# Patient Record
Sex: Male | Born: 1995 | Race: White | Hispanic: No | Marital: Single | Smoking: Never smoker
Health system: Southern US, Community
[De-identification: ages and names within clinical notes are randomized; demographics above are authoritative.]

## PROBLEM LIST (undated history)

## (undated) DIAGNOSIS — K219 Gastro-esophageal reflux disease without esophagitis: Secondary | ICD-10-CM

## (undated) DIAGNOSIS — F32A Depression, unspecified: Secondary | ICD-10-CM

## (undated) DIAGNOSIS — F329 Major depressive disorder, single episode, unspecified: Secondary | ICD-10-CM

## (undated) HISTORY — DX: Major depressive disorder, single episode, unspecified: F32.9

## (undated) HISTORY — DX: Depression, unspecified: F32.A

---
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
## 2020-10-20 ENCOUNTER — Encounter (HOSPITAL_COMMUNITY): Payer: Self-pay | Admitting: Emergency Medicine

## 2020-10-20 ENCOUNTER — Emergency Department (HOSPITAL_COMMUNITY): Payer: 59

## 2020-10-20 ENCOUNTER — Other Ambulatory Visit: Payer: Self-pay

## 2020-10-20 ENCOUNTER — Emergency Department (HOSPITAL_COMMUNITY)
Admission: EM | Admit: 2020-10-20 | Discharge: 2020-10-20 | Disposition: A | Discharge status: Home / Self Care | Payer: 59 | Attending: Emergency Medicine | Admitting: Emergency Medicine

## 2020-10-20 DIAGNOSIS — R000 Tachycardia, unspecified: Secondary | ICD-10-CM | POA: Diagnosis not present

## 2020-10-20 DIAGNOSIS — R42 Dizziness and giddiness: Secondary | ICD-10-CM | POA: Diagnosis not present

## 2020-10-20 DIAGNOSIS — R0602 Shortness of breath: Secondary | ICD-10-CM | POA: Insufficient documentation

## 2020-10-20 DIAGNOSIS — R079 Chest pain, unspecified: Secondary | ICD-10-CM | POA: Diagnosis not present

## 2020-10-20 HISTORY — DX: Gastro-esophageal reflux disease without esophagitis: K21.9

## 2020-10-20 LAB — CBC
HCT: 50 % (ref 39.0–52.0)
Hemoglobin: 16.4 g/dL (ref 13.0–17.0)
MCH: 31.3 pg (ref 26.0–34.0)
MCHC: 32.8 g/dL (ref 30.0–36.0)
MCV: 95.4 fL (ref 80.0–100.0)
Platelets: 269 10*3/uL (ref 150–400)
RBC: 5.24 MIL/uL (ref 4.22–5.81)
RDW: 12.7 % (ref 11.5–15.5)
WBC: 8.5 10*3/uL (ref 4.0–10.5)
nRBC: 0 % (ref 0.0–0.2)

## 2020-10-20 LAB — BASIC METABOLIC PANEL
Anion gap: 10 (ref 5–15)
BUN: 19 mg/dL (ref 6–20)
CO2: 24 mmol/L (ref 22–32)
Calcium: 9.3 mg/dL (ref 8.9–10.3)
Chloride: 106 mmol/L (ref 98–111)
Creatinine, Ser: 0.78 mg/dL (ref 0.61–1.24)
GFR, Estimated: >60 mL/min (ref >60)
Glucose, Bld: 99 mg/dL (ref 70–99)
Potassium: 4.3 mmol/L (ref 3.5–5.1)
Sodium: 140 mmol/L (ref 135–145)

## 2020-10-20 LAB — TROPONIN I (HIGH SENSITIVITY)
Troponin I (High Sensitivity): 3 ng/L (ref <18)
Troponin I (High Sensitivity): 4 ng/L (ref <18)

## 2020-10-20 NOTE — ED Triage Notes (Signed)
C/o squeezing pain to L chest since 12:30 with SOB and dizziness.  Denies nausea and vomiting.

## 2020-10-20 NOTE — ED Provider Notes (Signed)
[facility] EMERGENCY DEPARTMENT Provider Note   CSN: [id number] Arrival date & time: 10/20/20  1548     History Chief Complaint  Patient presents with  . Chest Pain    Wayne Bell is a 24 y.o. 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  States this is associated with a squeezing chest sensation (like "vice grips" on his heart), shortness of breath, and feeling like his heart was racing/palpitations.  He states that he thought maybe his blood sugar was low so he ate something with minimal improvement.  He then went to sit down and the sensation gradually improved. Symptoms have since resolved. However, he was concerned as this was the first time he had a feeling like this so he presented to the ED.  The history is provided by the patient.  Dizziness Quality:  Lightheadedness and vertigo Severity:  Mild Onset quality:  Gradual Duration: started at 10:30AM. Timing:  Constant Progression:  Improving Chronicity:  New Context: physical activity   Relieved by:  Nothing Worsened by:  Nothing Associated symptoms: chest pain, headaches (mild), palpitations and shortness of breath   Associated symptoms: no blood in stool, no diarrhea, no nausea, no syncope, no vision changes and no vomiting        Past Medical History:  Diagnosis Date  . Depression   . GERD (gastroesophageal reflux disease)     Patient Active Problem List   Diagnosis Date Noted  . GAD (generalized anxiety disorder) 03/02/2012  . MDD (major depressive disorder) 12/03/2011    History reviewed. No pertinent surgical history.     Family History  Problem Relation Age of Onset  . Anxiety disorder Sister     Social History   Tobacco Use  . Smoking status: Never Smoker  . Smokeless tobacco: Never Used  Substance Use Topics  . Alcohol use:  No  . Drug use: No    Home Medications Prior to Admission medications   Medication Sig Start Date End Date Taking? Authorizing Provider  polyethylene glycol powder (GLYCOLAX/MIRALAX) powder  04/06/12   [provider]  propranolol (INDERAL) 40 MG tablet Take 40 mg by mouth daily.  10/08/10   [provider]    Allergies    Patient has no known allergies.  Review of Systems   Review of Systems  Constitutional: Negative for chills and fever.  HENT: Negative for ear pain and sore throat.   Eyes: Negative for pain and visual disturbance.  Respiratory: Positive for shortness of breath. Negative for cough.   Cardiovascular: Positive for chest pain and palpitations. Negative for syncope.  Gastrointestinal: Negative for abdominal pain, blood in stool, diarrhea, nausea and vomiting.  Genitourinary: Negative for dysuria and hematuria.  Musculoskeletal: Negative for arthralgias and back pain.  Skin: Negative for color change and rash.  Neurological: Positive for dizziness and headaches (mild). Negative for seizures and syncope.  All other systems reviewed and are negative.   Physical Exam Updated Vital Signs BP 123/83 (BP Location: Right Arm)   Pulse 92   Temp 98 F (36.7 C) (Oral)   Resp (!) 22   SpO2 97%   Physical Exam Vitals and nursing note reviewed.  Constitutional:      General: He is not in acute distress.    Appearance: He is well-developed. He is obese. He is not ill-appearing, toxic-appearing  or diaphoretic.  HENT:     Head: Normocephalic and atraumatic.  Eyes:     Extraocular Movements: Extraocular movements intact.     Conjunctiva/sclera: Conjunctivae normal.     Pupils: Pupils are equal, round, and reactive to light.  Cardiovascular:     Rate and Rhythm: Normal rate and regular rhythm.     Pulses:          Radial pulses are 2+ on the right side and 2+ on the left side.       Posterior tibial pulses are 2+ on the right side and 2+ on the left  side.     Heart sounds: Normal heart sounds. Heart sounds not distant. No murmur heard.   Pulmonary:     Effort: Pulmonary effort is normal. No respiratory distress.     Breath sounds: Normal breath sounds. No decreased breath sounds, wheezing or rhonchi.  Chest:     Chest wall: No mass, deformity or tenderness.  Abdominal:     Palpations: Abdomen is soft.     Tenderness: There is no abdominal tenderness.  Musculoskeletal:     Cervical back: Normal range of motion and neck supple.  Skin:    General: Skin is warm and dry.  Neurological:     General: No focal deficit present.     Mental Status: He is alert and oriented to person, place, and time.     Cranial Nerves: No cranial nerve deficit.     Motor: No weakness.     ED Results / Procedures / Treatments   Labs (all labs ordered are listed, but only abnormal results are displayed) Labs Reviewed  BASIC METABOLIC PANEL  CBC  TROPONIN I (HIGH SENSITIVITY)  TROPONIN I (HIGH SENSITIVITY)    EKG EKG Interpretation  Date/Time:  Sunday October 20 2020 15:49:38 EST Ventricular Rate:  92 PR Interval:  162 QRS Duration: 86 QT Interval:  342 QTC Calculation: 422 R Axis:   49 Text Interpretation: Normal sinus rhythm Inferior infarct , age undetermined Abnormal ECG No old tracing to compare Confirmed by Susy Frizzle [phone]) on 10/20/2020 7:24:53 PM   Radiology DG Chest 2 View  Result Date: 10/20/2020 CLINICAL DATA:  Left-sided chest pain. EXAM: CHEST - 2 VIEW COMPARISON:  None. FINDINGS: The heart size and mediastinal contours are within normal limits. Both lungs are clear. The visualized skeletal structures are unremarkable. IMPRESSION: No active cardiopulmonary disease. Electronically Signed   By: Aram Candela M.D.   On: 10/20/2020 16:45    Procedures Procedures (including critical care time)  Medications Ordered in ED Medications - No data to display  ED Course  I have reviewed the triage vital signs and the  nursing notes.  Pertinent labs & imaging results that were available during my care of the patient were reviewed by me and considered in my medical decision making (see chart for details).    MDM Rules/Calculators/A&P                          MDM: Wayne Bell is a 24 y.o. male who presents with dizziness and chest pain as per above. I have reviewed the nursing documentation for past medical history, family history, and social history. Pertinent previous records reviewed. He is awake, alert. HDS. Afebrile. Physical exam is most notable for normal neuro exam, normal gait.  Labs: Troponin negative x2, CBC and BMP within normal limits. EKG: NSR, T wave versions in lead III. QTc, PR, and  QRS within appropriate limits. No signs of acute ischemia, infarct, or significant electrical abnormalities. No STEMI, ST depressions, or other significant T wave inversions. No evidence of a High-Grade Conduction Block, WPW, Brugada Sign, ARVC, DeWinters T Waves, or Wellens Waves. Imaging: CXR demonstrating no acute cardiopulmonary abnormality. Consults: none   Differential Dx: I am most concerned for possible arrhythmia versus dehydration. Given history, physical exam, and work-up, I do not think he has ACS, PE, cervical or vertebral artery dissection, stroke, and trauma, metabolic abnormality, severe dehydration, or life-threatening arrhythmia.  MDM: Wayne Bell is a 24 y.o. male presents with an episode of dizziness associated with chest pain and shortness of breath.  Normal neuro exam, normal gait, patient overall well-appearing.  Symptoms resolved on interview.  Patient describes palpitations and tachycardia thought likely warrant further work-up for possible underlying arrhythmia.  However, EKG does not show this and patient did not have any arrhythmia while in the ED on telemetry.  Recommended close outpatient follow-up with cardiology and PCP.  Also recommended that patient speak with his PCP  regarding possible Zio patch, patient voiced understanding.  Given history, exam, and work-up, do not feel that further ED work-up is indicated at this time.  Patient stable for discharge home.  Signs or symptoms that would warrant immediate return to the ED discussed with patient who voiced understanding.  Strict return precautions provided. Encouraged him to follow-up with his PCP on an outpatient basis. Questions were answered.  Patient discharged in stable condition.  The plan for this patient was discussed with Dr. Bernette Mayers, who voiced agreement and who oversaw evaluation and treatment of this patient.   Final Clinical Impression(s) / ED Diagnoses Final diagnoses:  Acute chest pain  Dizziness  Tachycardia    Rx / DC Orders ED Discharge Orders    None       Wayne Whitmoyer, MD 10/21/20 0131    Pollyann Savoy, MD 10/21/20 1057

## 2020-10-20 NOTE — Discharge Instructions (Addendum)
Follow-up with your PCP as soon as possible. Ask your PCP about getting a ZIO patch to look for abnormal heart rhythms.

## 2020-10-20 NOTE — ED Notes (Signed)
Patient verbalizes understanding of discharge instructions. Opportunity for questioning and answers were provided. Armband removed by staff, pt discharged from ED ambulatory to home.  

## 2021-11-27 IMAGING — CR DG CHEST 2V
2 series · 2 of 2 positions shown · non-contrast
Comparison: None.

CLINICAL DATA: Left-sided chest pain.

EXAM:
CHEST - 2 VIEW

[chest pa]
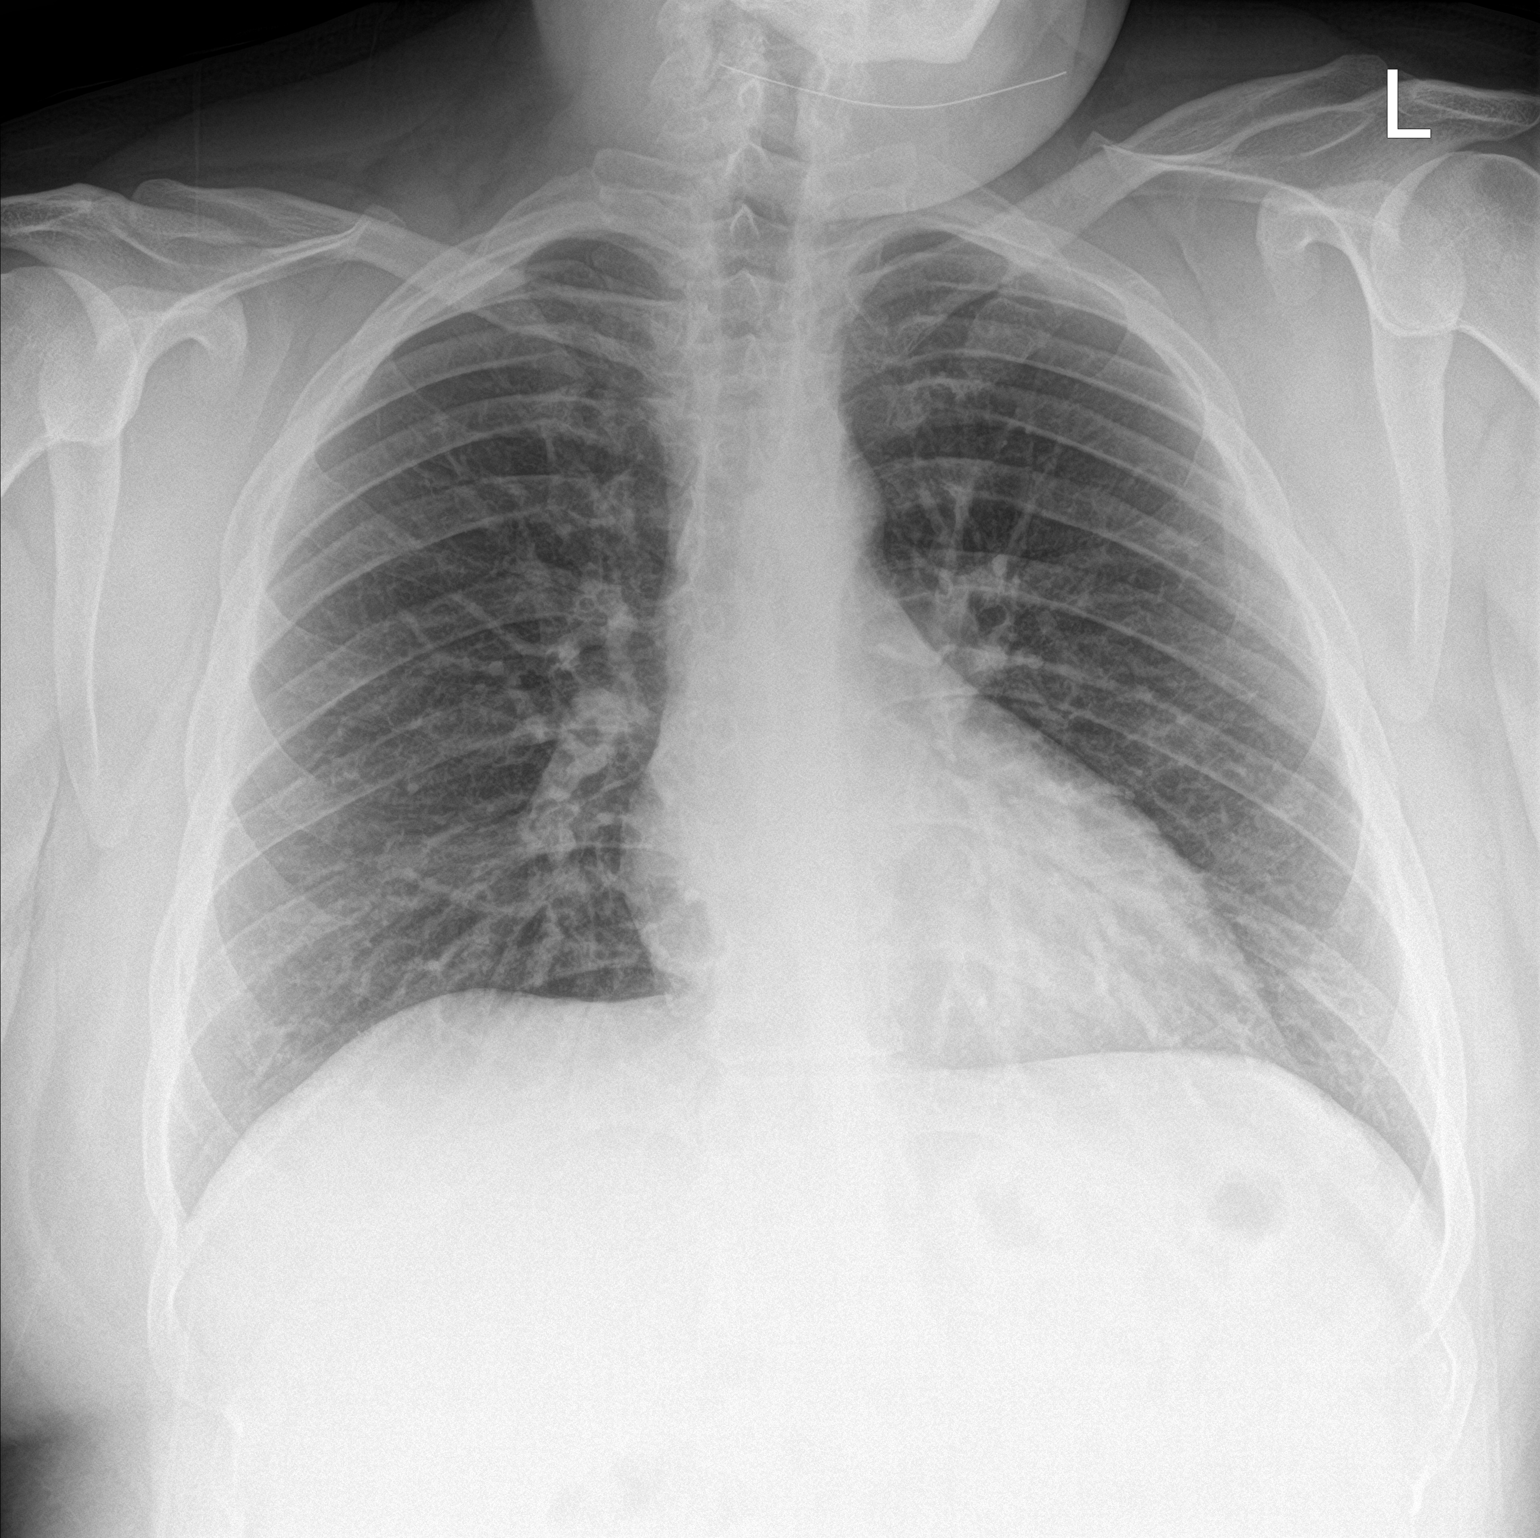

[chest lat]
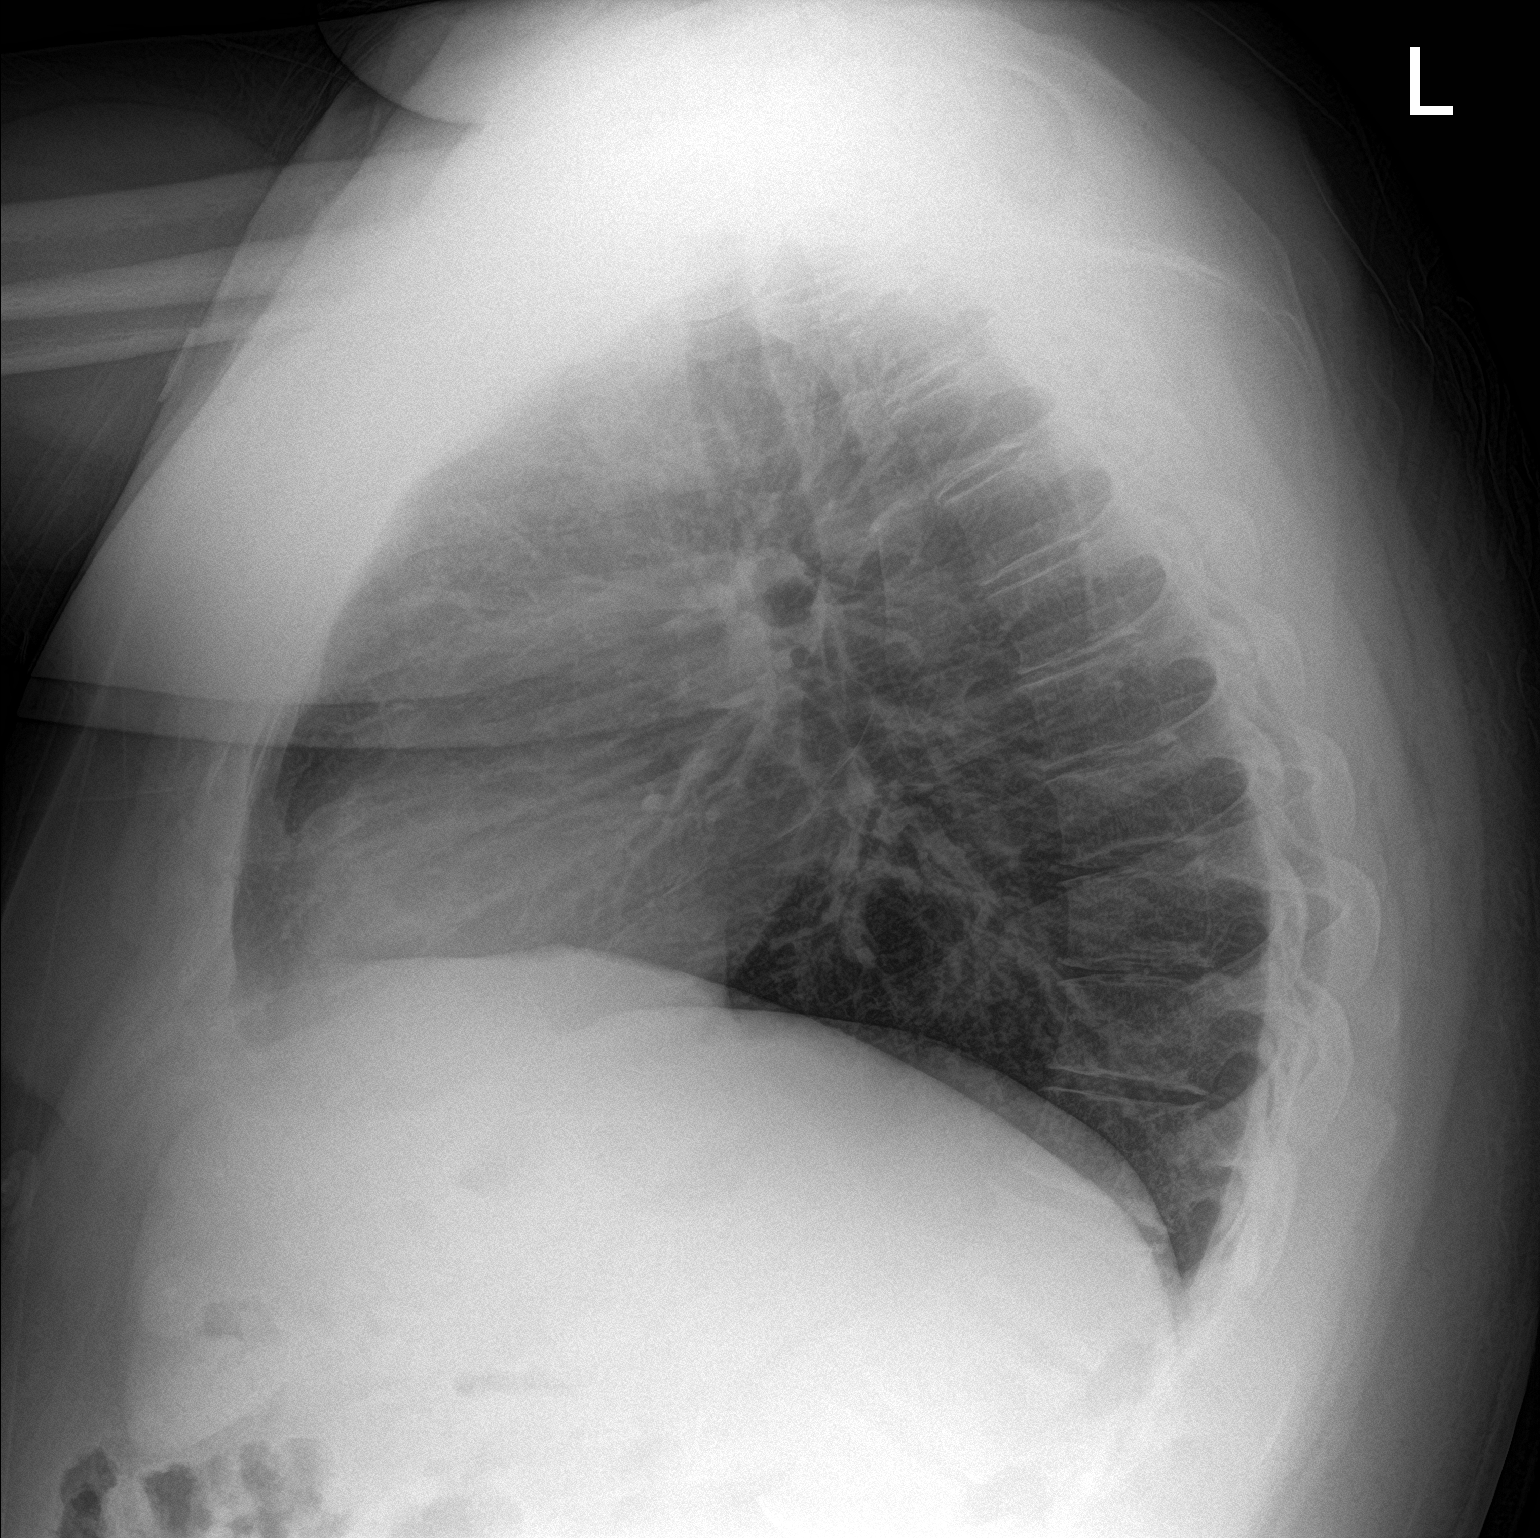

[2 of 2 positions shown; findings below may reference images not displayed]

FINDINGS: The heart size and mediastinal contours are within normal limits.
Both lungs are clear. The visualized skeletal structures are
unremarkable.
IMPRESSION: No active cardiopulmonary disease.
